# Patient Record
Sex: Female | Born: 1985 | Race: Black or African American | Hispanic: No | Marital: Married | Smoking: Former smoker
Health system: Southern US, Community
[De-identification: ages and names within clinical notes are randomized; demographics above are authoritative.]

## PROBLEM LIST (undated history)

## (undated) DIAGNOSIS — N760 Acute vaginitis: Secondary | ICD-10-CM

## (undated) DIAGNOSIS — E119 Type 2 diabetes mellitus without complications: Secondary | ICD-10-CM

## (undated) DIAGNOSIS — B9689 Other specified bacterial agents as the cause of diseases classified elsewhere: Secondary | ICD-10-CM

## (undated) DIAGNOSIS — N915 Oligomenorrhea, unspecified: Secondary | ICD-10-CM

## (undated) DIAGNOSIS — L680 Hirsutism: Secondary | ICD-10-CM

## (undated) DIAGNOSIS — Z8742 Personal history of other diseases of the female genital tract: Secondary | ICD-10-CM

## (undated) DIAGNOSIS — Z8619 Personal history of other infectious and parasitic diseases: Secondary | ICD-10-CM

## (undated) DIAGNOSIS — A599 Trichomoniasis, unspecified: Secondary | ICD-10-CM

## (undated) DIAGNOSIS — R51 Headache: Secondary | ICD-10-CM

## (undated) DIAGNOSIS — N9489 Other specified conditions associated with female genital organs and menstrual cycle: Secondary | ICD-10-CM

## (undated) DIAGNOSIS — B079 Viral wart, unspecified: Secondary | ICD-10-CM

## (undated) HISTORY — DX: Hirsutism: L68.0

## (undated) HISTORY — DX: Viral wart, unspecified: B07.9

## (undated) HISTORY — DX: Acute vaginitis: N76.0

## (undated) HISTORY — DX: Other specified conditions associated with female genital organs and menstrual cycle: N94.89

## (undated) HISTORY — DX: Trichomoniasis, unspecified: A59.9

## (undated) HISTORY — PX: EAR CYST EXCISION: SHX22

## (undated) HISTORY — DX: Oligomenorrhea, unspecified: N91.5

## (undated) HISTORY — DX: Personal history of other diseases of the female genital tract: Z87.42

## (undated) HISTORY — DX: Other specified bacterial agents as the cause of diseases classified elsewhere: B96.89

## (undated) HISTORY — DX: Personal history of other infectious and parasitic diseases: Z86.19

---

## 2010-01-03 ENCOUNTER — Emergency Department (HOSPITAL_COMMUNITY): Admission: EM | Admit: 2010-01-03 | Discharge: 2010-01-03 | Payer: Self-pay | Admitting: Family Medicine

## 2011-02-01 ENCOUNTER — Emergency Department (HOSPITAL_COMMUNITY): Payer: Self-pay

## 2011-02-01 ENCOUNTER — Emergency Department (HOSPITAL_COMMUNITY)
Admission: EM | Admit: 2011-02-01 | Discharge: 2011-02-01 | Disposition: A | Discharge status: Home / Self Care | Payer: Self-pay | Attending: Emergency Medicine | Admitting: Emergency Medicine

## 2011-02-01 DIAGNOSIS — I10 Essential (primary) hypertension: Secondary | ICD-10-CM | POA: Insufficient documentation

## 2011-02-01 DIAGNOSIS — R109 Unspecified abdominal pain: Secondary | ICD-10-CM | POA: Insufficient documentation

## 2011-02-01 DIAGNOSIS — M549 Dorsalgia, unspecified: Secondary | ICD-10-CM | POA: Insufficient documentation

## 2011-02-01 DIAGNOSIS — E669 Obesity, unspecified: Secondary | ICD-10-CM | POA: Insufficient documentation

## 2011-02-01 DIAGNOSIS — T148XXA Other injury of unspecified body region, initial encounter: Secondary | ICD-10-CM | POA: Insufficient documentation

## 2011-02-01 DIAGNOSIS — X500XXA Overexertion from strenuous movement or load, initial encounter: Secondary | ICD-10-CM | POA: Insufficient documentation

## 2011-02-01 LAB — URINALYSIS, ROUTINE W REFLEX MICROSCOPIC
Bilirubin Urine: NEGATIVE
Ketones, ur: NEGATIVE mg/dL
Nitrite: NEGATIVE
Protein, ur: NEGATIVE mg/dL
Urobilinogen, UA: 0.2 mg/dL (ref 0.0–1.0)

## 2011-02-01 LAB — POCT PREGNANCY, URINE: Preg Test, Ur: NEGATIVE

## 2011-02-02 LAB — URINE CULTURE
Colony Count: 35000
Culture  Setup Time: 201203090133

## 2011-02-15 LAB — POCT I-STAT, CHEM 8
BUN: 3 mg/dL — ABNORMAL LOW (ref 6–23)
Hemoglobin: 14.6 g/dL (ref 12.0–15.0)
Sodium: 138 mEq/L (ref 135–145)
TCO2: 32 mmol/L (ref 0–100)

## 2011-02-15 LAB — POCT URINALYSIS DIP (DEVICE)
Ketones, ur: NEGATIVE mg/dL
Nitrite: NEGATIVE
Protein, ur: >=300 mg/dL — AB
Urobilinogen, UA: 0.2 mg/dL (ref 0.0–1.0)
pH: 6.5 (ref 5.0–8.0)

## 2011-02-15 LAB — POCT PREGNANCY, URINE: Preg Test, Ur: NEGATIVE

## 2011-02-15 LAB — WET PREP, GENITAL

## 2011-05-17 ENCOUNTER — Emergency Department (HOSPITAL_COMMUNITY)
Admission: EM | Admit: 2011-05-17 | Discharge: 2011-05-17 | Disposition: A | Discharge status: Home / Self Care | Payer: Self-pay | Attending: Emergency Medicine | Admitting: Emergency Medicine

## 2011-05-17 DIAGNOSIS — N898 Other specified noninflammatory disorders of vagina: Secondary | ICD-10-CM | POA: Insufficient documentation

## 2011-05-17 DIAGNOSIS — R059 Cough, unspecified: Secondary | ICD-10-CM | POA: Insufficient documentation

## 2011-05-17 DIAGNOSIS — J3489 Other specified disorders of nose and nasal sinuses: Secondary | ICD-10-CM | POA: Insufficient documentation

## 2011-05-17 DIAGNOSIS — M542 Cervicalgia: Secondary | ICD-10-CM | POA: Insufficient documentation

## 2011-05-17 DIAGNOSIS — J069 Acute upper respiratory infection, unspecified: Secondary | ICD-10-CM | POA: Insufficient documentation

## 2011-05-17 DIAGNOSIS — R070 Pain in throat: Secondary | ICD-10-CM | POA: Insufficient documentation

## 2011-05-17 DIAGNOSIS — R109 Unspecified abdominal pain: Secondary | ICD-10-CM | POA: Insufficient documentation

## 2011-05-17 DIAGNOSIS — S139XXA Sprain of joints and ligaments of unspecified parts of neck, initial encounter: Secondary | ICD-10-CM | POA: Insufficient documentation

## 2011-05-17 DIAGNOSIS — E669 Obesity, unspecified: Secondary | ICD-10-CM | POA: Insufficient documentation

## 2011-05-17 DIAGNOSIS — X58XXXA Exposure to other specified factors, initial encounter: Secondary | ICD-10-CM | POA: Insufficient documentation

## 2011-05-17 DIAGNOSIS — J45909 Unspecified asthma, uncomplicated: Secondary | ICD-10-CM | POA: Insufficient documentation

## 2011-05-17 DIAGNOSIS — R05 Cough: Secondary | ICD-10-CM | POA: Insufficient documentation

## 2011-05-17 LAB — URINALYSIS, ROUTINE W REFLEX MICROSCOPIC
Ketones, ur: NEGATIVE mg/dL
Leukocytes, UA: NEGATIVE
Nitrite: NEGATIVE
Protein, ur: NEGATIVE mg/dL
pH: 6.5 (ref 5.0–8.0)

## 2011-05-17 LAB — WET PREP, GENITAL: WBC, Wet Prep HPF POC: NONE SEEN

## 2011-05-17 LAB — POCT PREGNANCY, URINE: Preg Test, Ur: NEGATIVE

## 2011-05-18 LAB — GC/CHLAMYDIA PROBE AMP, GENITAL
Chlamydia, DNA Probe: NEGATIVE
GC Probe Amp, Genital: NEGATIVE

## 2011-08-20 ENCOUNTER — Other Ambulatory Visit: Payer: Self-pay | Admitting: Obstetrics and Gynecology

## 2011-09-05 DIAGNOSIS — N9489 Other specified conditions associated with female genital organs and menstrual cycle: Secondary | ICD-10-CM

## 2011-09-05 HISTORY — DX: Other specified conditions associated with female genital organs and menstrual cycle: N94.89

## 2011-09-05 NOTE — Patient Instructions (Addendum)
   Your procedure is scheduled on: Thursday, Oct. 18, 2012 at 10:45am  Enter through the Main Entrance of [facility] at: 9:15am Pick up the phone at the desk and dial [phone] and inform us of your arrival.  Please call this number if you have any problems the morning of surgery: [phone]  Remember: Do not eat food after midnight: Wednesday Do not drink clear liquids after:  Wednesday Take these medicines the morning of surgery with a SIP OF WATER:  none  Do not wear jewelry, make-up, or FINGER nail polish Do not wear lotions, powders, or perfumes.  You may wear deodorant. Do not shave 48 hours prior to surgery. Do not bring valuables to the hospital.  Pt's mother patient's mother Malachi Bonds  [phone] to take pt. Home.  Remember to use your hibiclens as instructed.Please shower with 1/2 bottle the evening before your surgery and the other 1/2 bottle the morning of surgery.

## 2011-09-06 ENCOUNTER — Encounter (HOSPITAL_COMMUNITY)
Admission: RE | Admit: 2011-09-06 | Discharge: 2011-09-06 | Disposition: A | Discharge status: Home / Self Care | Payer: BC Managed Care – PPO | Source: Ambulatory Visit | Attending: Obstetrics and Gynecology | Admitting: Obstetrics and Gynecology

## 2011-09-06 ENCOUNTER — Encounter (HOSPITAL_COMMUNITY): Payer: Self-pay

## 2011-09-06 HISTORY — DX: Headache: R51

## 2011-09-06 LAB — CBC
Platelets: 238 10*3/uL (ref 150–400)
RBC: 4.29 MIL/uL (ref 3.87–5.11)
WBC: 11 10*3/uL — ABNORMAL HIGH (ref 4.0–10.5)

## 2011-09-07 NOTE — H&P (Signed)
NAME:  Stacey Delgado, Stacey Delgado NO.:  [id number]  MEDICAL RECORD NO.:  [id number]  LOCATION:  [facility]                         FACILITY:  [facility]  PHYSICIAN:  Osborn Coho, M.D.   DATE OF BIRTH:  Sep 25, 1986  DATE OF ADMISSION:  08/17/2011 DATE OF DISCHARGE:                             HISTORY & PHYSICAL   HISTORY OF PRESENT ILLNESS:  Stacey Delgado is a 25 year old single black female, gravida 0 presenting for hysteroscopy, D and C with possible resection of an endometrial mass because of menorrhagia.  Over 10 years, the patient reports that she has always had heavy menstrual periods, but in the past year, symptoms have increased.  She goes on to say that she has missed as many as 5 consecutive menstrual periods and at one time was placed on oral contraceptives to regulate her periods. When the patient took oral contraceptives, her periods were regular but once she discontinued, her irregularity returned.  In the past year, the patient's menstrual flow may last 14-18 days during which time she may have to change a pad every 30 minutes to an hour.  There have been occasions that she passes such large clots that she will soil her clothing and furniture.  She reports cramping that she rates as a 10/10 on a 10 point pain scale, however, finds relief with ibuprofen 800 mg (decreasing her pain to a 2/10 on a 10 point pain scale). She admits to nausea with this flow but denies any urinary tract symptoms, changes in her bowel movements, dyspareunia, or vaginitis symptoms.  An endometrial biopsy done in September, 2012 revealed fragments suggestive of benign endometrial polyps, but was negative for hyperplasia, atypia, or malignancy.  A pelvic ultrasound at that same time showed a uterus measuring 6.90 x 5.15 x 3.87 cm, and a possible endometrial mass measuring 1.86 x 1.87 x 1.58 cm.  Both the patient's ovaries appeared normal on that study.  Given the protracted and disruptive  nature of her symptoms and after reviewing both medical and surgical management options, the patient has decided to proceed with hysteroscopy, D and C with possible resection of an endometrial mass.  PAST MEDICAL HISTORY/OB HISTORY:  Gravida 0.  GYNECOLOGIC HISTORY:  Menarche 25 years old.  Her last menstrual period was September 03, 2011.  She uses abstinence for contraception.  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  Her last normal Pap smear was October 2012.  MEDICAL HISTORY:  Asthma, vitamin D deficiency, hirsutism, and anemia.  SURGICAL HISTORY:  Negative.  FAMILY HISTORY:  Asthma, hypertension, diabetes mellitus, migraines, breast cancer (mother at age 35).  SOCIAL HISTORY:  The patient is single and she is a Consulting civil engineer at [employer].  HABITS:  She occasionally consumes alcohol.  Denies any tobacco or illicit drug use.  CURRENT MEDICATIONS:  Vitamin D 50,000 units twice weekly.  ALLERGIES:  She has no known drug allergies.  Denies any sensitivities to latex, soy, peanuts, or shellfish.  She does, however, develop a rash when exposed to cherries.  REVIEW OF SYSTEMS:  The patient wears contact lenses.  She does have frequent headaches for which she received a  negative neurologic workup. Denies any chest pain, shortness of breath, vision changes, difficulty swallowing, chronic cough, nausea, vomiting, diarrhea, dysuria, hematuria, urinary frequency, myalgias, arthralgias, night sweats, joint swelling, and except as is mentioned in history of present illness, the patient's review of systems is otherwise negative.  PHYSICAL EXAMINATION:  VITAL SIGNS:  Blood pressure 120/70, pulse is 72, respiration 18, temperature 97.9 degrees Fahrenheit orally, weight 282 pounds, height 5 feet 4-1/2 inches tall, body mass index 48.4. NECK:  Supple without masses.  There is no  thyromegaly or cervical adenopathy. HEART:  Regular rate and rhythm. LUNGS:  Clear. BACK:  No CVA tenderness. ABDOMEN:  No tenderness, masses, or organomegaly. EXTREMITIES:  No clubbing, cyanosis, or edema. PELVIC:  EGBUS is normal.  Vagina is normal.  There is blood in the vaginal vault.  Cervix is nontender without lesions.  Uterus appears normal size, shape, and consistency without tenderness though exam is limited by body habitus.  Adnexa without tenderness or masses.  IMPRESSION: 1. Menorrhagia. 2. Endometrial mass 3. Oligomenorrhea.  DISPOSITION:  Discussion was held with the patient regarding the indications for her procedures along with their risks which include, but are not limited to, reaction to anesthesia, damage to adjacent organs, infection, excessive bleeding, and endometrial scarring.  The patient was given an ACOG brochure on hysteroscopy and an ACOG brochure on the dilatation and curettage.  The patient verbalized understanding of these risks and has consented to proceed with hysteroscopy, D and C, possible resection of an endometrial mass at [facility] on September 13, 2011 at 10:45 a.m.     Erasto Sleight J. Lowell Guitar, P.A.-C   ______________________________ Osborn Coho, M.D.    EJP/MEDQ  D:  09/06/2011  T:  09/06/2011  Job:  [id number]  09-13-11 Agree with above and no change in H&P. - AYR

## 2011-09-13 ENCOUNTER — Ambulatory Visit (HOSPITAL_COMMUNITY): Payer: BC Managed Care – PPO | Admitting: Anesthesiology

## 2011-09-13 ENCOUNTER — Other Ambulatory Visit: Payer: Self-pay | Admitting: Obstetrics and Gynecology

## 2011-09-13 ENCOUNTER — Encounter (HOSPITAL_COMMUNITY): Payer: Self-pay | Admitting: *Deleted

## 2011-09-13 ENCOUNTER — Ambulatory Visit (HOSPITAL_COMMUNITY)
Admission: RE | Admit: 2011-09-13 | Discharge: 2011-09-13 | Disposition: A | Discharge status: Home / Self Care | Payer: BC Managed Care – PPO | Source: Ambulatory Visit | Attending: Obstetrics and Gynecology | Admitting: Obstetrics and Gynecology

## 2011-09-13 ENCOUNTER — Encounter (HOSPITAL_COMMUNITY): Payer: Self-pay | Admitting: Anesthesiology

## 2011-09-13 ENCOUNTER — Encounter (HOSPITAL_COMMUNITY)
Admission: RE | Disposition: A | Discharge status: Home / Self Care | Payer: Self-pay | Source: Ambulatory Visit | Attending: Obstetrics and Gynecology

## 2011-09-13 DIAGNOSIS — N920 Excessive and frequent menstruation with regular cycle: Secondary | ICD-10-CM | POA: Insufficient documentation

## 2011-09-13 DIAGNOSIS — N840 Polyp of corpus uteri: Secondary | ICD-10-CM | POA: Insufficient documentation

## 2011-09-13 HISTORY — PX: HYSTEROSCOPY W/D&C: SHX1775

## 2011-09-13 LAB — HCG, SERUM, QUALITATIVE: Preg, Serum: NEGATIVE

## 2011-09-13 SURGERY — DILATATION AND CURETTAGE /HYSTEROSCOPY
Anesthesia: General | Site: Vagina | Wound class: Clean Contaminated

## 2011-09-13 MED ORDER — MIDAZOLAM HCL 5 MG/5ML IJ SOLN
INTRAMUSCULAR | Status: DC | PRN
  Administered 2011-09-13: 1 mg via INTRAVENOUS

## 2011-09-13 MED ORDER — GLYCOPYRROLATE 0.2 MG/ML IJ SOLN
INTRAMUSCULAR | Status: DC | PRN
  Administered 2011-09-13: 0.2 mg via INTRAVENOUS

## 2011-09-13 MED ORDER — KETOROLAC TROMETHAMINE 60 MG/2ML IM SOLN
INTRAMUSCULAR | Status: AC
  Filled 2011-09-13: qty 2

## 2011-09-13 MED ORDER — DEXAMETHASONE SODIUM PHOSPHATE 10 MG/ML IJ SOLN
INTRAMUSCULAR | Status: AC
  Filled 2011-09-13: qty 1

## 2011-09-13 MED ORDER — EPHEDRINE SULFATE 50 MG/ML IJ SOLN
INTRAMUSCULAR | Status: DC | PRN
  Administered 2011-09-13: 5 mg via INTRAVENOUS

## 2011-09-13 MED ORDER — DEXAMETHASONE SODIUM PHOSPHATE 4 MG/ML IJ SOLN
INTRAMUSCULAR | Status: DC | PRN
  Administered 2011-09-13: 10 mg via INTRAVENOUS

## 2011-09-13 MED ORDER — ONDANSETRON HCL 4 MG/2ML IJ SOLN
INTRAMUSCULAR | Status: AC
  Filled 2011-09-13: qty 2

## 2011-09-13 MED ORDER — HYDROCODONE-ACETAMINOPHEN 5-500 MG PO TABS: 1.0000 | ORAL_TABLET | Freq: Four times a day (QID) | ORAL | Status: AC | PRN

## 2011-09-13 MED ORDER — FENTANYL CITRATE 0.05 MG/ML IJ SOLN
INTRAMUSCULAR | Status: AC
  Filled 2011-09-13: qty 2

## 2011-09-13 MED ORDER — FENTANYL CITRATE 0.05 MG/ML IJ SOLN
INTRAMUSCULAR | Status: DC | PRN
  Administered 2011-09-13 (×2): 50 ug via INTRAVENOUS

## 2011-09-13 MED ORDER — LIDOCAINE HCL (CARDIAC) 20 MG/ML IV SOLN
INTRAVENOUS | Status: DC | PRN
  Administered 2011-09-13: 100 mg via INTRAVENOUS

## 2011-09-13 MED ORDER — KETOROLAC TROMETHAMINE 60 MG/2ML IM SOLN
INTRAMUSCULAR | Status: DC | PRN
  Administered 2011-09-13: 30 mg via INTRAMUSCULAR

## 2011-09-13 MED ORDER — KETOROLAC TROMETHAMINE 30 MG/ML IJ SOLN
INTRAMUSCULAR | Status: DC | PRN
  Administered 2011-09-13: 30 mg via INTRAVENOUS

## 2011-09-13 MED ORDER — ONDANSETRON HCL 4 MG/2ML IJ SOLN
INTRAMUSCULAR | Status: DC | PRN
  Administered 2011-09-13: 4 mg via INTRAVENOUS

## 2011-09-13 MED ORDER — PROPOFOL 10 MG/ML IV EMUL
INTRAVENOUS | Status: DC | PRN
  Administered 2011-09-13: 200 mg via INTRAVENOUS

## 2011-09-13 MED ORDER — MIDAZOLAM HCL 2 MG/2ML IJ SOLN
INTRAMUSCULAR | Status: AC
  Filled 2011-09-13: qty 2

## 2011-09-13 MED ORDER — LIDOCAINE HCL 1 % IJ SOLN
INTRAMUSCULAR | Status: DC | PRN
  Administered 2011-09-13: 10 mL

## 2011-09-13 MED ORDER — PROPOFOL 10 MG/ML IV EMUL
INTRAVENOUS | Status: AC
  Filled 2011-09-13: qty 20

## 2011-09-13 MED ORDER — LIDOCAINE HCL (CARDIAC) 20 MG/ML IV SOLN
INTRAVENOUS | Status: AC
  Filled 2011-09-13: qty 5

## 2011-09-13 MED ORDER — LACTATED RINGERS IV SOLN
INTRAVENOUS | Status: DC
  Administered 2011-09-13: 10:00:00 via INTRAVENOUS

## 2011-09-13 MED ORDER — IBUPROFEN 600 MG PO TABS: 600.0000 mg | ORAL_TABLET | Freq: Four times a day (QID) | ORAL | Status: AC | PRN

## 2011-09-13 SURGICAL SUPPLY — 16 items
CANISTER SUCTION 2500CC (MISCELLANEOUS) ×3 IMPLANT
CATH ROBINSON RED A/P 16FR (CATHETERS) ×3 IMPLANT
CLOTH BEACON ORANGE TIMEOUT ST (SAFETY) ×3 IMPLANT
CONTAINER PREFILL 10% NBF 60ML (FORM) ×6 IMPLANT
DRAPE UTILITY XL STRL (DRAPES) ×6 IMPLANT
ELECT REM PT RETURN 9FT ADLT (ELECTROSURGICAL) ×3
ELECTRODE REM PT RTRN 9FT ADLT (ELECTROSURGICAL) ×2 IMPLANT
GLOVE BIO SURGEON STRL SZ7.5 (GLOVE) ×6 IMPLANT
GLOVE BIOGEL PI IND STRL 7.5 (GLOVE) ×2 IMPLANT
GLOVE BIOGEL PI INDICATOR 7.5 (GLOVE) ×1
GOWN PREVENTION PLUS LG XLONG (DISPOSABLE) ×3 IMPLANT
GOWN STRL REIN XL XLG (GOWN DISPOSABLE) ×3 IMPLANT
LOOP ANGLED CUTTING 22FR (CUTTING LOOP) IMPLANT
PACK HYSTEROSCOPY LF (CUSTOM PROCEDURE TRAY) ×3 IMPLANT
TOWEL OR 17X24 6PK STRL BLUE (TOWEL DISPOSABLE) ×6 IMPLANT
WATER STERILE IRR 1000ML POUR (IV SOLUTION) ×3 IMPLANT

## 2011-09-13 NOTE — Anesthesia Postprocedure Evaluation (Signed)
Anesthesia Post Note  Patient: Stacey Delgado  Procedure(s) Performed:  DILATATION AND CURETTAGE (D&C) /HYSTEROSCOPY  Anesthesia type: GA  Patient location: PACU  Post pain: Pain level controlled  Post assessment: Post-op Vital signs reviewed  Last Vitals:  Filed Vitals:   09/13/11 1254  BP:   Pulse:   Temp: 98.2 F (36.8 C)  Resp:     Post vital signs: Reviewed  Level of consciousness: sedated  Complications: No apparent anesthesia complications

## 2011-09-13 NOTE — Anesthesia Preprocedure Evaluation (Signed)
Anesthesia Evaluation  Name, MR# and DOB Patient awake  General Assessment Comment  Reviewed: Allergy & Precautions, H&P , NPO status , Patient's Chart, lab work & pertinent test results, reviewed documented beta blocker date and time   History of Anesthesia Complications Negative for: history of anesthetic complications  Airway Mallampati: III TM Distance: >3 FB Neck ROM: full    Dental  (+) Teeth Intact   Pulmonary asthma (has never used an inhaler) former smoker (quit 2010) clear to auscultation  Pulmonary exam normal       Cardiovascular Exercise Tolerance: Good regular Normal    Neuro/Psych  Headaches (lately better, but had been everyday.), Negative Psych ROS   GI/Hepatic negative GI ROS Neg liver ROS    Endo/Other  Morbid obesity  Renal/GU negative Renal ROS  Genitourinary negative   Musculoskeletal   Abdominal   Peds  Hematology negative hematology ROS (+)   Anesthesia Other Findings   Reproductive/Obstetrics negative OB ROS                           Anesthesia Physical Anesthesia Plan  ASA: II  Anesthesia Plan: General   Post-op Pain Management:    Induction:   Airway Management Planned:   Additional Equipment:   Intra-op Plan:   Post-operative Plan:   Informed Consent: I have reviewed the patients History and Physical, chart, labs and discussed the procedure including the risks, benefits and alternatives for the proposed anesthesia with the patient or authorized representative who has indicated his/her understanding and acceptance.   Dental Advisory Given  Plan Discussed with: CRNA and Surgeon  Anesthesia Plan Comments:         Anesthesia Quick Evaluation

## 2011-09-13 NOTE — Op Note (Signed)
Preop Diagnosis: Menorrhagia, Endometrial Mass   Postop Diagnosis: * No post-op diagnosis entered *   Procedure: DILATATION & CURETTAGE/HYSTEROSCOPY WITH RESECTOSCOPE   Anesthesia: Choice   Anesthesiologist: Amy L. Rodman Pickle   Attending: Purcell Nails, MD   Assistant: N/a  Findings: No obvious intracavitary lesions  Pathology: Endometrial currettings  Fluids: 1000cc  UOP: QS via straight cath prior to procedure  EBL: Minimal  Complications: None  Procedure: The patient was taken to the operating room after the risks, benefits and alternatives discussed with the patient. The patient verbalized understanding and consent signed and witnessed. The patient was placed under general anesthesia and prepped and draped in the normal sterile fashion and Time Out performed per protocol.  A bivalve speculum was placed in the patient's vagina and the anterior lip of the cervix was grasped with a single tooth tenaculum. A paracervical block was administered using a total of 10 cc of 1% lidocaine. The uterus sounded to 8 1/2 cm. The cervix was dilated for passage of the hysteroscope.  The hysteroscope was introduced into the uterine cavity and findings as noted above. Sharp curettage was performed until a gritty texture was noted and currettings sent to pathology. The hysteroscope was reintroduced and no obvious remaining intracavitary lesions were noted.  All instruments were removed. Sponge lap and needle count was correct. The patient tolerated the procedure well and was returned to the recovery room in good condition.

## 2011-09-13 NOTE — Anesthesia Procedure Notes (Signed)
Procedure Name: LMA Insertion Date/Time: 09/13/2011 11:01 AM Performed by: Karleen Dolphin Pre-anesthesia Checklist: Patient identified, Patient being monitored, Emergency Drugs available, Timeout performed and Suction available Patient Re-evaluated:Patient Re-evaluated prior to inductionOxygen Delivery Method: Circle System Utilized Preoxygenation: Pre-oxygenation with 100% oxygen Intubation Type: IV induction LMA: LMA inserted LMA Size: 4.0 Number of attempts: 1 Placement Confirmation: positive ETCO2 and breath sounds checked- equal and bilateral Tube secured with: Tape Dental Injury: Teeth and Oropharynx as per pre-operative assessment

## 2011-09-13 NOTE — Transfer of Care (Signed)
Immediate Anesthesia Transfer of Care Note  Patient: Stacey Delgado  Procedure(s) Performed:  DILATATION AND CURETTAGE (D&C) /HYSTEROSCOPY  Patient Location: PACU  Anesthesia Type: General  Level of Consciousness: awake, alert  and oriented  Airway & Oxygen Therapy: Patient Spontanous Breathing and Patient connected to nasal cannula oxygen  Post-op Assessment: Report given to PACU RN and Post -op Vital signs reviewed and stable  Post vital signs: Reviewed and stable  Complications: No apparent anesthesia complications

## 2011-09-14 ENCOUNTER — Encounter (HOSPITAL_COMMUNITY): Payer: Self-pay | Admitting: Obstetrics and Gynecology

## 2011-09-16 NOTE — Brief Op Note (Signed)
09/13/2011  8:18 PM  PATIENT:  Stacey Delgado  25 y.o. female  PRE-OPERATIVE DIAGNOSIS:  Menorrhagia, Endometrial Mass  POST-OPERATIVE DIAGNOSIS:  Menorrhagia, Endometrial Mass  PROCEDURE:  Procedure(s): DILATATION AND CURETTAGE (D&C) /HYSTEROSCOPY  SURGEON:  Surgeon(s): Purcell Nails, MD  PHYSICIAN ASSISTANT:   ASSISTANTS: none   ANESTHESIA:   general  EBL:   minimal  BLOOD ADMINISTERED:none  DRAINS: none   LOCAL MEDICATIONS USED:  LIDOCAINE 10 CC  SPECIMEN:  Source of Specimen:  endometrial currettings  DISPOSITION OF SPECIMEN:  PATHOLOGY  COUNTS:  YES  TOURNIQUET:  * No tourniquets in log *  DICTATION: .Note written in EPIC  PLAN OF CARE: Discharge to home after PACU  PATIENT DISPOSITION:  PACU - hemodynamically stable.   Delay start of Pharmacological VTE agent (>24hrs) due to surgical blood loss or risk of bleeding:  not applicable

## 2011-09-16 NOTE — Op Note (Signed)
Preop Diagnosis: Menorrhagia, Endometrial Mass   Postop Diagnosis: Menorrhagia, Endometrial Mass   Procedure: DILATATION & CURETTAGE/HYSTEROSCOPY WITH RESECTOSCOPE   Anesthesia: General   Attending: Dr. Su Hilt  Assistant: n/a  Findings: No obvious intracavitary lesions  Pathology: Endometrial currettings  Fluids:  UOP: QS via straight cath procedure prior to procedure  EBL: Minimal  Complications: None  Procedure: The patient was taken to the operating room after the risks, benefits and alternatives discussed with the patient. The patient verbalized understanding and consent signed and witnessed. The patient was placed under general anesthesia and prepped and draped in the normal sterile fashion and Time Out performed per protocol.  A bivalve speculum was placed in the patient's vagina and the anterior lip of the cervix was grasped with a single tooth tenaculum. A paracervical block was administered using a total of 10 cc of 1% lidocaine.  The cervix was dilated for passage of the hysteroscope.  The hysteroscope was introduced into the uterine cavity and findings as noted above. Sharp curettage was performed until a gritty texture was noted and currettings sent to pathology. The hysteroscope was reintroduced and no obvious remaining intracavitary lesions were noted.  All instruments were removed. Sponge lap and needle count was correct. The patient tolerated the procedure well and was returned to the recovery room in good condition.

## 2012-02-20 ENCOUNTER — Encounter (HOSPITAL_COMMUNITY): Payer: Self-pay | Admitting: Emergency Medicine

## 2012-02-20 ENCOUNTER — Encounter: Payer: Self-pay | Admitting: Obstetrics and Gynecology

## 2012-02-20 ENCOUNTER — Emergency Department (HOSPITAL_COMMUNITY)
Admission: EM | Admit: 2012-02-20 | Discharge: 2012-02-20 | Disposition: A | Discharge status: Home / Self Care | Payer: Self-pay | Attending: Emergency Medicine | Admitting: Emergency Medicine

## 2012-02-20 DIAGNOSIS — E119 Type 2 diabetes mellitus without complications: Secondary | ICD-10-CM | POA: Insufficient documentation

## 2012-02-20 DIAGNOSIS — J45909 Unspecified asthma, uncomplicated: Secondary | ICD-10-CM | POA: Insufficient documentation

## 2012-02-20 DIAGNOSIS — Z87891 Personal history of nicotine dependence: Secondary | ICD-10-CM | POA: Insufficient documentation

## 2012-02-20 LAB — BASIC METABOLIC PANEL
Calcium: 10.1 mg/dL (ref 8.4–10.5)
Creatinine, Ser: 0.56 mg/dL (ref 0.50–1.10)
GFR calc Af Amer: >90 mL/min (ref >90)
GFR calc non Af Amer: >90 mL/min (ref >90)
Sodium: 134 mEq/L — ABNORMAL LOW (ref 135–145)

## 2012-02-20 LAB — URINALYSIS, ROUTINE W REFLEX MICROSCOPIC
Bilirubin Urine: NEGATIVE
Nitrite: NEGATIVE
Specific Gravity, Urine: 1.042 — ABNORMAL HIGH (ref 1.005–1.030)
Urobilinogen, UA: 0.2 mg/dL (ref 0.0–1.0)

## 2012-02-20 LAB — URINE MICROSCOPIC-ADD ON

## 2012-02-20 LAB — POCT PREGNANCY, URINE: Preg Test, Ur: NEGATIVE

## 2012-02-20 MED ORDER — GLYBURIDE 5 MG PO TABS: 5.0000 mg | ORAL_TABLET | Freq: Every day | ORAL | Status: AC

## 2012-02-20 MED ORDER — INSULIN ASPART 100 UNIT/ML ~~LOC~~ SOLN
5.0000 [IU] | Freq: Once | SUBCUTANEOUS | Status: AC
  Administered 2012-02-20: 5 [IU] via INTRAVENOUS
  Filled 2012-02-20: qty 5

## 2012-02-20 MED ORDER — SODIUM CHLORIDE 0.9 % IV BOLUS (SEPSIS)
1000.0000 mL | Freq: Once | INTRAVENOUS | Status: AC
  Administered 2012-02-20: 1000 mL via INTRAVENOUS

## 2012-02-20 NOTE — ED Notes (Signed)
Pt states she went to pcp today do see if she was pregnant but they told her that her urine had to much glucose so they did not complete urine pregnancy test.

## 2012-02-20 NOTE — ED Provider Notes (Signed)
History     CSN: [id number]  Arrival date & time 02/20/12  1248   First MD Initiated Contact with Patient 02/20/12 1302      Chief Complaint  Patient presents with  . Hyperglycemia    (Consider location/radiation/quality/duration/timing/severity/associated sxs/prior treatment) The history is provided by the patient.   the patient is a 26 year old morbidly obese.  Patient with a history of asthma, who presents to the emergency department stating that her blood sugar was greater than 500.  She has never been diagnosed with diabetes.  Before.  She has had polyuria, polydipsia for several weeks, along with weight loss.  She also has had nausea and vomiting.  She denies pyuria, dysuria, cough, or shortness of breath.  She went to see her physician, and obstetrician today, who noted.  Sugar in her urine, therefore, they did a blood tests, noted.  It was elevated, as well, and they sent the patient, here.  She denies pain anywhere.  Past Medical History  Diagnosis Date  . Asthma     no inhaler  . Headache     otc med prn    Past Surgical History  Procedure Date  . Ear cyst excision   . Hysteroscopy w/d&c 09/13/2011    Procedure: DILATATION AND CURETTAGE (D&C) /HYSTEROSCOPY;  Surgeon: Purcell Nails, MD;  Location: [facility];  Service: Gynecology;  Laterality: N/A;    No family history on file.  History  Substance Use Topics  . Smoking status: Former Smoker    Types: Cigarettes    Quit date: 04/09/2009  . Smokeless tobacco: Never Used  . Alcohol Use: No     1-2- drinks per week    OB History    Grav Para Term Preterm Abortions TAB SAB Ect Mult Living                  Review of Systems  Constitutional: Positive for unexpected weight change. Negative for fever and chills.  Eyes: Negative for visual disturbance.  Respiratory: Negative for cough and shortness of breath.   Cardiovascular: Negative for chest pain.  Gastrointestinal: Positive for nausea and vomiting. Negative  for abdominal pain and diarrhea.  Genitourinary: Positive for frequency. Negative for dysuria and hematuria.  Neurological: Negative for weakness and headaches.  Psychiatric/Behavioral: Negative for confusion.  All other systems reviewed and are negative.    Allergies  Review of patient's allergies indicates no known allergies.  Home Medications   Current Outpatient Rx  Name Route Sig Dispense Refill  . VITAMIN D (ERGOCALCIFEROL) 50000 UNITS PO CAPS Oral Take 50,000 Units by mouth 2 (two) times a week.        BP 132/78  Pulse 87  Temp(Src) 98.7 F (37.1 C) (Oral)  Resp 20  SpO2 99%  LMP 12/11/2011  Physical Exam  Nursing note and vitals reviewed. Constitutional: She is oriented to person, place, and time. She appears well-developed. No distress.       Morbid obesity  HENT:  Head: Normocephalic and atraumatic.  Eyes: Pupils are equal, round, and reactive to light.  Neck: Normal range of motion.  Cardiovascular: Normal rate, regular rhythm and normal heart sounds.   No murmur heard. Pulmonary/Chest: Effort normal and breath sounds normal. No respiratory distress. She has no wheezes. She has no rales.  Abdominal: Soft. She exhibits no distension and no mass. There is no tenderness. There is no rebound and no guarding.  Musculoskeletal: Normal range of motion. She exhibits no edema and no tenderness.  Neurological: She is alert and oriented to person, place, and time. No cranial nerve deficit.  Skin: Skin is warm and dry. No rash noted. No erythema.  Psychiatric: She has a normal mood and affect. Her behavior is normal.    ED Course  Procedures (including critical care time) 26 year old, female, with new onset diabetes mellitus, presents to the emergency department with several week history of polyuria, polydipsia.  Involuntary weight loss.  Labs Reviewed  GLUCOSE, CAPILLARY - Abnormal; Notable for the following:    Glucose-Capillary 401 (*)    All other components  within normal limits  URINALYSIS, ROUTINE W REFLEX MICROSCOPIC  BASIC METABOLIC PANEL   No results found.   No diagnosis found.  3:15 PM Remains asx   MDM  New onset diabetes No evidence of dka.        Cheri Guppy, MD 02/20/12 1515

## 2012-02-20 NOTE — Discharge Instructions (Signed)
Your blood sugar is too high.  You have diabetes.  Use glyburide to control your blood sugar.  Follow up with [facility] for reevaluation.  Alternatively, follow up with any family practice doctor or at an urgent care center for reevaluation.  Call for appointment time.  Return for worse symptoms.

## 2012-02-20 NOTE — ED Notes (Signed)
Pt presenting to ed with c/o sent by pcp for hyperglycemia pt states blood sugar was 501at pcp office. Pt states frequent urination but she drinks a lot of water all the time. Pt states lower abdominal pain. Pt denies blurred vision. Pt is alert and oriented

## 2012-02-25 ENCOUNTER — Other Ambulatory Visit: Payer: Self-pay | Admitting: Obstetrics and Gynecology

## 2012-02-25 DIAGNOSIS — R638 Other symptoms and signs concerning food and fluid intake: Secondary | ICD-10-CM

## 2012-02-25 DIAGNOSIS — N949 Unspecified condition associated with female genital organs and menstrual cycle: Secondary | ICD-10-CM

## 2012-03-04 DIAGNOSIS — N762 Acute vulvitis: Secondary | ICD-10-CM

## 2012-03-04 DIAGNOSIS — R638 Other symptoms and signs concerning food and fluid intake: Secondary | ICD-10-CM | POA: Insufficient documentation

## 2012-03-05 ENCOUNTER — Other Ambulatory Visit: Payer: Self-pay

## 2012-03-05 ENCOUNTER — Encounter: Payer: Self-pay | Admitting: Obstetrics and Gynecology

## 2012-03-21 ENCOUNTER — Other Ambulatory Visit: Payer: Self-pay

## 2012-03-21 ENCOUNTER — Telehealth: Payer: Self-pay

## 2012-03-21 NOTE — Telephone Encounter (Signed)
Pt called back to let me know that she has an up-coming appt with Endocrinologist on 03/26/2012. Vit D will be called to the [facility] on [address]. Per AR. Recall will be put in epic for f/u. Stacey Delgado A

## 2012-03-21 NOTE — Telephone Encounter (Signed)
LM for pt to cb. She needs Vit D protocol and referral if that has not already been done by another provider. Pt was seen in ER and rec. To f/u with [facility].for DM.Melody Comas A

## 2012-03-21 NOTE — Telephone Encounter (Signed)
orders and recall for vit D entered into epic for f/u. Melody Comas A

## 2012-03-21 NOTE — Telephone Encounter (Signed)
Vit D 50,000 units 1x weekly x 12 weeks #20 0 RF called to [address]. Melody Comas A

## 2016-12-23 ENCOUNTER — Emergency Department (INDEPENDENT_AMBULATORY_CARE_PROVIDER_SITE_OTHER): Payer: BLUE CROSS/BLUE SHIELD

## 2016-12-23 ENCOUNTER — Emergency Department
Admission: EM | Admit: 2016-12-23 | Discharge: 2016-12-23 | Disposition: A | Discharge status: Home / Self Care | Payer: BLUE CROSS/BLUE SHIELD | Source: Home / Self Care | Attending: Family Medicine | Admitting: Family Medicine

## 2016-12-23 DIAGNOSIS — R05 Cough: Secondary | ICD-10-CM

## 2016-12-23 DIAGNOSIS — R52 Pain, unspecified: Secondary | ICD-10-CM

## 2016-12-23 DIAGNOSIS — R0789 Other chest pain: Secondary | ICD-10-CM

## 2016-12-23 DIAGNOSIS — R51 Headache: Secondary | ICD-10-CM

## 2016-12-23 DIAGNOSIS — R062 Wheezing: Secondary | ICD-10-CM | POA: Diagnosis not present

## 2016-12-23 DIAGNOSIS — R1084 Generalized abdominal pain: Secondary | ICD-10-CM

## 2016-12-23 DIAGNOSIS — R059 Cough, unspecified: Secondary | ICD-10-CM

## 2016-12-23 DIAGNOSIS — R519 Headache, unspecified: Secondary | ICD-10-CM

## 2016-12-23 LAB — POCT URINALYSIS DIP (MANUAL ENTRY)
Bilirubin, UA: NEGATIVE
Blood, UA: NEGATIVE
Glucose, UA: 500 — AB
Leukocytes, UA: NEGATIVE
Nitrite, UA: NEGATIVE
Protein Ur, POC: NEGATIVE
Spec Grav, UA: 1.015 (ref 1.005–1.03)
Urobilinogen, UA: 0.2 (ref 0–1)
pH, UA: 5.5 (ref 5–8)

## 2016-12-23 LAB — POCT URINE PREGNANCY: Preg Test, Ur: NEGATIVE

## 2016-12-23 MED ORDER — ALBUTEROL SULFATE HFA 108 (90 BASE) MCG/ACT IN AERS: 1.0000 | INHALATION_SPRAY | Freq: Four times a day (QID) | RESPIRATORY_TRACT | 0 refills | Status: DC | PRN

## 2016-12-23 MED ORDER — BENZONATATE 100 MG PO CAPS: 100.0000 mg | ORAL_CAPSULE | Freq: Three times a day (TID) | ORAL | 0 refills | Status: DC

## 2016-12-23 MED ORDER — PREDNISONE 20 MG PO TABS: ORAL_TABLET | ORAL | 0 refills | Status: DC

## 2016-12-23 NOTE — ED Provider Notes (Signed)
CSN: [id number]     Arrival date & time 12/23/16  1524 History   First MD Initiated Contact with Patient 12/23/16 1553     Chief Complaint  Patient presents with  . Headache   (Consider location/radiation/quality/duration/timing/severity/associated sxs/prior Treatment) HPI Donovan KailMelanie R Williams-Taft is a 31 y.o. female presenting to UC with c/o generalized abdominal pain, frontal headache, cough and congestion that started 2 days ago, wheeze was worse yesterday but improved with albuterol inhaler.  Mild nausea but no vomiting or diarrhea. She reports slip and fall on ice 1 week ago but denies hitting her head. She reports hx of headaches normally.  Denies change in vision. Denies numbness or tingling in arms or legs. No back pain, urinary or vaginal symptoms. She has taken OTC cough/cold medication for last two days with mild relief. No known sick contacts. She did not get the flu vaccine this year.    Past Medical History:  Diagnosis Date  . Asthma    no inhaler  . BV (bacterial vaginosis)    2012  . Endometrial mass 09/05/2011  . H/O chlamydia infection   . H/O menorrhagia   . Headache(784.0)    otc med prn  . Hirsutism   . Oligomenorrhea   . Trichomonas   . 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 Vitamin D deficiency    Past Surgical History:  Procedure Laterality Date  . EAR CYST EXCISION    . HYSTEROSCOPY W/D&C  09/13/2011   Procedure: DILATATION AND CURETTAGE (D&C) /HYSTEROSCOPY;  Surgeon: Purcell NailsAngela Y Roberts, MD;  Location: [facility];  Service: Gynecology;  Laterality: N/A;   Family History  Problem Relation Age of Onset  . Cancer Mother    Social History  Substance Use Topics  . Smoking status: Former Smoker    Types: Cigarettes    Quit date: 04/09/2009  . Smokeless tobacco: Never Used  . Alcohol use No     Comment: 1-2- drinks per week   OB History    No data available     Review of Systems  Constitutional: Negative for chills and fever.  HENT: Positive  for congestion. Negative for ear pain, sore throat, trouble swallowing and voice change.   Respiratory: Positive for cough. Negative for shortness of breath.   Cardiovascular: Negative for chest pain and palpitations.  Gastrointestinal: Positive for abdominal pain and nausea. Negative for diarrhea and vomiting.  Genitourinary: Negative for dysuria, flank pain, frequency and urgency.  Musculoskeletal: Negative for arthralgias, back pain and myalgias.  Skin: Negative for rash.  Neurological: Positive for headaches. Negative for dizziness and light-headedness.    Allergies  Benadryl [diphenhydramine] and Cherry  Home Medications   Prior to Admission medications   Medication Sig Start Date End Date Taking? Authorizing Provider  clotrimazole-betamethasone (LOTRISONE) cream Apply topically 2 (two) times daily.   Yes Historical Provider, MD  Prenatal Vit-Fe Fumarate-FA (PRENATAL MULTIVITAMIN) TABS Take 1 tablet by mouth daily.   Yes Historical Provider, MD  albuterol (PROVENTIL HFA;VENTOLIN HFA) 108 (90 Base) MCG/ACT inhaler Inhale 1-2 puffs into the lungs every 6 (six) hours as needed for wheezing or shortness of breath. 12/23/16   Junius FinnerErin O'Malley, PA-C  benzonatate (TESSALON) 100 MG capsule Take 1-2 capsules (100-200 mg total) by mouth every 8 (eight) hours. 12/23/16   Junius FinnerErin O'Malley, PA-C  predniSONE (DELTASONE) 20 MG tablet 3 tabs po day one, then 2 po daily x 4 days 12/23/16   Junius FinnerErin O'Malley, PA-C   Meds Ordered and Administered this  Visit  Medications - No data to display  BP 132/87 (BP Location: Left Arm)   Pulse 88   Temp 98.1 F (36.7 C) (Oral)   Ht 5\' 4"  (1.626 m)   Wt 264 lb (119.7 kg)   LMP 09/26/2016 (Approximate)   SpO2 96%   BMI 45.32 kg/m  No data found.   Physical Exam  Constitutional: She is oriented to person, place, and time. She appears well-developed and well-nourished. No distress.  HENT:  Head: Normocephalic and atraumatic.  Right Ear: Tympanic membrane normal.   Left Ear: Tympanic membrane normal.  Nose: Nose normal.  Mouth/Throat: Uvula is midline, oropharynx is clear and moist and mucous membranes are normal.  Eyes: EOM are normal.  Neck: Normal range of motion. Neck supple.  Cardiovascular: Normal rate and regular rhythm.   Pulmonary/Chest: Effort normal and breath sounds normal. No stridor. No respiratory distress. She has no wheezes. She has no rales.  Abdominal: Soft. She exhibits no distension and no mass. There is tenderness (suprapubic, mild). There is no rebound and no guarding.  Musculoskeletal: Normal range of motion.  Lymphadenopathy:    She has no cervical adenopathy.  Neurological: She is alert and oriented to person, place, and time.  Skin: Skin is warm and dry. She is not diaphoretic.  Psychiatric: She has a normal mood and affect. Her behavior is normal.  Nursing note and vitals reviewed.   Urgent Care Course     Procedures (including critical care time)  Labs Review Labs Reviewed  POCT URINALYSIS DIP (MANUAL ENTRY) - Abnormal; Notable for the following:       Result Value   Glucose, UA =500 (*)    Ketones, POC UA trace (5) (*)    All other components within normal limits  POCT URINE PREGNANCY - Normal    Imaging Review Dg Chest 2 View  Result Date: 12/23/2016 CLINICAL DATA:  Cough, wheezing and chest tightness for 3 days. EXAM: CHEST  2 VIEW COMPARISON:  None. FINDINGS: The cardiomediastinal silhouette is unremarkable. Mild peribronchial thickening noted. There is no evidence of focal airspace disease, pulmonary edema, suspicious pulmonary nodule/mass, pleural effusion, or pneumothorax. No acute bony abnormalities are identified. IMPRESSION: Mild peribronchial thickening without focal pneumonia, of uncertain chronicity Electronically Signed   By: Harmon Pier M.D.   On: 12/23/2016 16:19     MDM   1. Frontal headache   2. Body aches   3. Generalized abdominal pain   4. Cough   5. Wheeze    Pt presenting to UC  with multiple complaints most c/w a viral illness. Lungs: CTAB, however, due to reports of SOB, CXR performed to r/o underlying early pneumonia  CXR: mild peribronchial thickening w/o focal pneumonia.  Reassured pt, symptoms likely viral. Rx: Prednisone, albuterol, and tessalon encouraged fluids and rest. She declined zofran or phenergan as nausea is minimal  F/u with PCP in 1 week if not improving.     Junius Finner, PA-C 12/23/16 1644

## 2016-12-23 NOTE — ED Triage Notes (Signed)
Patient complains of abdominal pain and headache, she states that she fell on the ice last Saturday and that's when she started having issues, states abdominal pain comes and goes, denies any diarrhea or vomiting and was only nauseas a little today, states the pain radiates from side to side and it comes and goes. States she does not think she hit her head and has a history of headaches anyway.

## 2017-01-20 ENCOUNTER — Encounter: Payer: Self-pay | Admitting: Emergency Medicine

## 2017-01-20 ENCOUNTER — Emergency Department
Admission: EM | Admit: 2017-01-20 | Discharge: 2017-01-20 | Disposition: A | Discharge status: Home / Self Care | Payer: 59 | Source: Home / Self Care | Attending: Family Medicine | Admitting: Family Medicine

## 2017-01-20 DIAGNOSIS — N921 Excessive and frequent menstruation with irregular cycle: Secondary | ICD-10-CM

## 2017-01-20 DIAGNOSIS — R109 Unspecified abdominal pain: Secondary | ICD-10-CM

## 2017-01-20 LAB — POCT CBC W AUTO DIFF (K'VILLE URGENT CARE)

## 2017-01-20 LAB — POCT URINE PREGNANCY: Preg Test, Ur: NEGATIVE

## 2017-01-20 LAB — POCT FASTING CBG KUC MANUAL ENTRY: POCT Glucose (KUC): 246 mg/dL — AB (ref 70–99)

## 2017-01-20 NOTE — ED Triage Notes (Signed)
Pt c/o severe abdominal cramping that comes and goes since last night.  Some vaginal bleeding, some blood clots, bleeding has eased up.

## 2017-01-20 NOTE — Discharge Instructions (Signed)
Your menstrual cramps and heavy flow could be due to no longer being on birth control as stress, changes in hormones, or various other things can alter a woman's menstrual cycle.    You may need to have a pelvic ultrasound to rule out a cyst, fibroid, or other physical cause of a heavy cycle.  You may need to try a different form of birth control to help control your symptoms.

## 2017-01-20 NOTE — ED Provider Notes (Signed)
CSN: [id number]     Arrival date & time 01/20/17  1727 History   First MD Initiated Contact with Patient 01/20/17 1831     Chief Complaint  Patient presents with  . Abdominal Cramping   (Consider location/radiation/quality/duration/timing/severity/associated sxs/prior Treatment) HPI  Stacey Delgado is a 31 y.o. female presenting to UC with c/o severe abdominal cramping and heavy menstrual bleeding.  She reports having a hx of irregular cycle. LMP was in Oct/Nov 2017.  Around that time, she was had an Implanon type birth control in place but was having heavy cycles almost non-stop and was becoming anemic so she had the birth control taken out.  She is not currently on any form of birth control. Denies fever, chills, n/v/d. Denies dysuria.  Bleeding has slowed some but she was concerned she may become anemic again if it continues.  She has not f/u with GYN yet as symptoms just started last night.   Denies known hx of ovarian cysts or fibroids but has been told she may have polyps.  Denies concern for STIs or pregnancy.   Past Medical History:  Diagnosis Date  . Asthma    no inhaler  . BV (bacterial vaginosis)    2012  . Endometrial mass 09/05/2011  . H/O chlamydia infection   . H/O menorrhagia   . Headache(784.0)    otc med prn  . Hirsutism   . Oligomenorrhea   . Trichomonas   . 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 Vitamin D deficiency    Past Surgical History:  Procedure Laterality Date  . EAR CYST EXCISION    . HYSTEROSCOPY W/D&C  09/13/2011   Procedure: DILATATION AND CURETTAGE (D&C) /HYSTEROSCOPY;  Surgeon: Purcell NailsAngela Y Roberts, MD;  Location: [facility];  Service: Gynecology;  Laterality: N/A;   Family History  Problem Relation Age of Onset  . Cancer Mother    Social History  Substance Use Topics  . Smoking status: Former Smoker    Types: Cigarettes    Quit date: 04/09/2009  . Smokeless tobacco: Never Used  . Alcohol use No     Comment: 1-2- drinks per  week   OB History    No data available     Review of Systems  Constitutional: Negative for chills and fever.  Respiratory: Negative for chest tightness and shortness of breath.   Cardiovascular: Negative for chest pain and palpitations.  Gastrointestinal: Positive for abdominal pain ( lower abdominal cramping). Negative for diarrhea, nausea and vomiting.  Genitourinary: Positive for pelvic pain ( cramping) and vaginal bleeding. Negative for dysuria and flank pain.  Musculoskeletal: Negative for arthralgias and myalgias.  Neurological: Positive for headaches. Negative for dizziness and light-headedness.    Allergies  Benadryl [diphenhydramine] and Cherry  Home Medications   Prior to Admission medications   Medication Sig Start Date End Date Taking? Authorizing Provider  Prenatal Vit-Fe Fumarate-FA (PRENATAL MULTIVITAMIN) TABS Take 1 tablet by mouth daily.    Historical Provider, MD   Meds Ordered and Administered this Visit  Medications - No data to display  BP 96/69 (BP Location: Left Arm)   Pulse 89   Temp 98.1 F (36.7 C) (Oral)   Ht 5\' 4"  (1.626 m)   Wt 264 lb 8 oz (120 kg)   SpO2 96%   BMI 45.40 kg/m  No data found.   Physical Exam  Constitutional: She is oriented to person, place, and time. She appears well-developed and well-nourished. No distress.  Sitting on exam  bed, appears well, non-toxic. NAD  HENT:  Head: Normocephalic and atraumatic.  Eyes: EOM are normal.  Neck: Normal range of motion.  Cardiovascular: Normal rate.   Pulmonary/Chest: Effort normal and breath sounds normal. No respiratory distress. She has no wheezes. She has no rales.  Abdominal: Soft. She exhibits no distension and no mass. There is no tenderness. There is no rebound and no guarding.  Musculoskeletal: Normal range of motion.  Neurological: She is alert and oriented to person, place, and time.  Skin: Skin is warm and dry. She is not diaphoretic.  Psychiatric: She has a normal mood  and affect. Her behavior is normal.  Nursing note and vitals reviewed.   Urgent Care Course     Procedures (including critical care time)  Labs Review Labs Reviewed  POCT FASTING CBG [facility] MANUAL ENTRY - Abnormal; Notable for the following:       Result Value   POCT Glucose ([facility]) 246 (*)    All other components within normal limits  POCT URINE PREGNANCY  POCT CBC W AUTO DIFF ([facility])    Imaging Review No results found.    MDM   1. Abdominal cramping   2. Menorrhagia with irregular cycle    Heavy menstrual cycle and abdominal cramping.  Pt did take out her birth control due to irregular cycles in Oct/Nov 2017, which is her last known cycle.    Urine preg: negative CBC: WNL, no evidence of anemia   Encouraged f/u with GYN this week for further evaluation as she may need to get back on birth control and/or have a pelvic U/S to evaluate for fibroids or cysts.   Discussed symptoms that warrant emergent care in the ED. Patient verbalized understanding and agreement with treatment plan.     Junius Finner, PA-C 01/22/17 1304

## 2017-03-12 ENCOUNTER — Emergency Department
Admission: EM | Admit: 2017-03-12 | Discharge: 2017-03-12 | Disposition: A | Discharge status: Home / Self Care | Payer: BLUE CROSS/BLUE SHIELD | Source: Home / Self Care | Attending: Emergency Medicine | Admitting: Emergency Medicine

## 2017-03-12 ENCOUNTER — Encounter: Payer: Self-pay | Admitting: Emergency Medicine

## 2017-03-12 DIAGNOSIS — R600 Localized edema: Secondary | ICD-10-CM

## 2017-03-12 DIAGNOSIS — R1012 Left upper quadrant pain: Secondary | ICD-10-CM

## 2017-03-12 DIAGNOSIS — R1011 Right upper quadrant pain: Secondary | ICD-10-CM

## 2017-03-12 DIAGNOSIS — R102 Pelvic and perineal pain: Secondary | ICD-10-CM

## 2017-03-12 HISTORY — DX: Type 2 diabetes mellitus without complications: E11.9

## 2017-03-12 LAB — POCT CBC W AUTO DIFF (K'VILLE URGENT CARE)

## 2017-03-12 LAB — POCT URINALYSIS DIP (MANUAL ENTRY)
Bilirubin, UA: NEGATIVE
Blood, UA: NEGATIVE
Glucose, UA: 500 mg/dL — AB
Leukocytes, UA: NEGATIVE
Nitrite, UA: NEGATIVE
Protein Ur, POC: NEGATIVE mg/dL
Spec Grav, UA: 1.025 (ref 1.010–1.025)
Urobilinogen, UA: 0.2 E.U./dL
pH, UA: 5.5 (ref 5.0–8.0)

## 2017-03-12 LAB — POCT URINE PREGNANCY: Preg Test, Ur: NEGATIVE

## 2017-03-12 MED ORDER — FUROSEMIDE 20 MG PO TABS: 20.0000 mg | ORAL_TABLET | Freq: Two times a day (BID) | ORAL | 0 refills | Status: DC

## 2017-03-12 NOTE — ED Triage Notes (Signed)
Pt c/o swelling in her feet bilaterally x 3 mths. She also c/o LT side abd pain x today.

## 2017-03-12 NOTE — ED Provider Notes (Signed)
[facility]    CSN: [id number] Arrival date & time: 03/12/17  1745     History   Chief Complaint Chief Complaint  Patient presents with  . Foot Pain  . Abdominal Pain  She states her most important chief complaint is abdominal pain/fullness.  She has a second chief complaint of bilateral intermittent foot swelling and discomfort for 3 months.  HPI Stacey Delgado is a 31 y.o. female.   The history is provided by the patient.  Abdominal Pain  Pain location:  LUQ, RUQ and suprapubic Pain quality: cramping, dull, fullness and pressure   Pain radiates to:  Does not radiate Pain severity:  Mild Onset quality:  Unable to specify Duration:  1 day Timing:  Unable to specify Progression:  Unchanged Chronicity:  New Context: not awakening from sleep, not diet changes, not eating, not recent illness, not recent travel, not sick contacts, not suspicious food intake and not trauma   Relieved by:  Nothing Worsened by:  Palpation Ineffective treatments:  None tried Associated symptoms: fatigue   Associated symptoms: no anorexia, no chest pain, no chills, no constipation, no cough, no diarrhea, no dysuria, no fever, no hematemesis, no hematochezia, no hematuria, no melena, no nausea, no shortness of breath, no sore throat, no vaginal bleeding, no vaginal discharge and no vomiting   Risk factors: obesity   Risk factors: no alcohol abuse and no recent hospitalization    Her bilateral foot swelling and discomfort improved last night after she soaked both feet in Epsom salts.  Past Medical History:  Diagnosis Date  . Asthma    no inhaler  . BV (bacterial vaginosis)    2012  . Diabetes mellitus without complication (HCC)   . Endometrial mass 09/05/2011  . H/O chlamydia infection   . H/O menorrhagia   . Headache(784.0)    otc med prn  . Hirsutism   . Oligomenorrhea   . Trichomonas   . 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 Vitamin D deficiency      Patient Active Problem List   Diagnosis Date Noted  . 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 Increased BMI 03/04/2012    Past Surgical History:  Procedure Laterality Date  . EAR CYST EXCISION    . HYSTEROSCOPY W/D&C  09/13/2011   Procedure: DILATATION AND CURETTAGE (D&C) /HYSTEROSCOPY;  Surgeon: Purcell Nails, MD;  Location: [facility];  Service: Gynecology;  Laterality: N/A;    OB History    No data available       Home Medications    Prior to Admission medications   Medication Sig Start Date End Date Taking? Authorizing Provider  furosemide (LASIX) 20 MG tablet Take 1 tablet (20 mg total) by mouth 2 (two) times daily. IF NEEDED for swelling or edema 03/12/17   Lajean Manes, MD  Prenatal Vit-Fe Fumarate-FA (PRENATAL MULTIVITAMIN) TABS Take 1 tablet by mouth daily.    Historical Provider, MD    Family History Family History  Problem Relation Age of Onset  . Cancer Mother     Social History Social History  Substance Use Topics  . Smoking status: Former Smoker    Types: Cigarettes    Quit date: 04/09/2009  . Smokeless tobacco: Never Used  . Alcohol use No     Comment: 1-2- drinks per week     Allergies   Benadryl [diphenhydramine] and Cherry   Review of Systems Review of Systems  Constitutional: Positive for fatigue. Negative for chills and fever.  HENT: Negative for sore throat.   Respiratory: Negative for cough, chest tightness and shortness of breath.   Cardiovascular: Negative for chest pain and palpitations.  Gastrointestinal: Positive for abdominal pain. Negative for anorexia, constipation, diarrhea, hematemesis, hematochezia, melena, nausea and vomiting.  Genitourinary: Negative for dysuria, hematuria, vaginal bleeding and vaginal discharge.  Musculoskeletal:       Intermittent bilateral foot swelling and pain for 3 months. Denies calf pain. Recalls no injury  Skin: Negative for color change, rash and wound.  All other systems reviewed and are  negative.    Physical Exam Triage Vital Signs ED Triage Vitals  Enc Vitals Group     BP 03/12/17 1807 133/87     Pulse Rate 03/12/17 1807 87     Resp 03/12/17 1807 18     Temp 03/12/17 1807 98.3 F (36.8 C)     Temp Source 03/12/17 1807 Oral     SpO2 03/12/17 1807 98 %     Weight 03/12/17 1810 269 lb (122 kg)     Height 03/12/17 1810  (1.626 m)     Head Circumference --      Peak Flow --      Pain Score 03/12/17 1810 0     Pain Loc --      Pain Edu? --      Excl. in GC? --    No data found.   Updated Vital Signs BP 133/87 (BP Location: Left Arm)   Pulse 87   Temp 98.3 F (36.8 C) (Oral)   Resp 18   Ht  (1.626 m)   Wt 269 lb (122 kg)   LMP 01/13/2016 Comment: implannon removed 11/17'  SpO2 98%   BMI 46.17 kg/m    Physical Exam  Constitutional: She is oriented to person, place, and time. She appears well-developed and well-nourished. No distress.  Very obese female, cooperative, appears mildly uncomfortable but does not appear acutely ill. Nontoxic appearance. No acute distress.  HENT:  Head: Normocephalic and atraumatic.  Mouth/Throat: Oropharynx is clear and moist.  Neck: Normal range of motion.  Cardiovascular: Normal rate and regular rhythm.   Pulmonary/Chest: Effort normal.  Abdominal: Soft. Bowel sounds are normal. She exhibits distension (Mildly distended versus very obese abdomen). There is no rebound and no guarding. No hernia.  +Tenderness right upper quadrant, right mid abdomen, left upper quadrant, left mid abdomen, and suprapubic area. No masses guarding or rebound.  Musculoskeletal:  Trace bilateral pedal edema without any specific tender trigger point. Trace bilateral pretibial edema.  Homans sign negative bilaterally. No calf tenderness. No cords felt.  Neurological: She is alert and oriented to person, place, and time. No cranial nerve deficit.  Skin: Capillary refill takes less than 2 seconds. No rash noted. She is not diaphoretic.   Psychiatric: She has a normal mood and affect.     UC Treatments / Results  Labs (all labs ordered are listed, but only abnormal results are displayed) Labs Reviewed  POCT URINALYSIS DIP (MANUAL ENTRY) - Abnormal; Notable for the following:       Result Value   Clarity, UA cloudy (*)    Glucose, UA =500 (*)    Ketones, POC UA trace (5) (*)    All other components within normal limits  POCT CBC W AUTO DIFF ([facility])  POCT URINE PREGNANCY   Urine pregnancy test negative. CBC: WBC 11.4, normal differential    Hemoglobin 14.5   Platelets normal 212,000   EKG  EKG Interpretation None       Radiology No results found.  Procedures Procedures (including critical care time)  Medications Ordered in UC Medications - No data to display   Initial Impression / Assessment and Plan / UC Course  I have reviewed the triage vital signs and the nursing notes.  Pertinent labs & imaging results that were available during my care of the patient were reviewed by me and considered in my medical decision making (see chart for details).  Final Clinical Impressions(s) / UC Diagnoses  Clinically, no evidence of acute abdomen. No right lower quadrant tenderness.  Intermittent foot swelling for 3 months likely from venous insufficiency and dietary indiscretion of eating too much salty foods.  She admits that she's not following healthy diet and has not seen her doctor for follow-up of diabetes in some time.    Final diagnoses:  Right upper quadrant abdominal pain  Left upper quadrant pain  Suprapubic pain  Bilateral leg edema   Treatment options discussed, as well as risks, benefits, alternatives. Patient voiced understanding and agreement with the following plans: Trial of short-term low-dose furosemide to help with venous insufficiency and edema. Support socks/stockings. At time of discharge from urgent care, she states her abdominal pain is only minimal and she declined  any further workup or treatment for this at this time. (Work note given at her request)  New Prescriptions New Prescriptions   FUROSEMIDE (LASIX) 20 MG TABLET    Take 1 tablet (20 mg total) by mouth 2 (two) times daily. IF NEEDED for swelling or edema  I urged her to follow up within the next week with her PCP to reevaluate all the above and recheck diabetes. Also urged her to follow up with her gynecologist within the next week for pelvic exam and preventative GYN care.  Precautions discussed. Red flags discussed.-Emergency room if any red flag or any severe or worsening symptom. Questions invited and answered. Patient voiced understanding and agreement.    Lajean Manes, MD 03/14/17 [phone]

## 2017-03-15 ENCOUNTER — Telehealth: Payer: Self-pay | Admitting: Emergency Medicine

## 2017-03-15 NOTE — Telephone Encounter (Signed)
Left message inquiring about patient's status and encouraged her to call with questions/concerns. pk

## 2017-03-16 ENCOUNTER — Telehealth: Payer: Self-pay | Admitting: Emergency Medicine

## 2017-03-19 ENCOUNTER — Encounter: Payer: Self-pay | Admitting: *Deleted

## 2017-03-19 ENCOUNTER — Emergency Department
Admission: EM | Admit: 2017-03-19 | Discharge: 2017-03-19 | Disposition: A | Discharge status: Home / Self Care | Payer: BLUE CROSS/BLUE SHIELD | Source: Home / Self Care | Attending: Family Medicine | Admitting: Family Medicine

## 2017-03-19 DIAGNOSIS — J069 Acute upper respiratory infection, unspecified: Secondary | ICD-10-CM | POA: Diagnosis not present

## 2017-03-19 DIAGNOSIS — B9789 Other viral agents as the cause of diseases classified elsewhere: Secondary | ICD-10-CM

## 2017-03-19 DIAGNOSIS — J9801 Acute bronchospasm: Secondary | ICD-10-CM

## 2017-03-19 MED ORDER — ALBUTEROL SULFATE HFA 108 (90 BASE) MCG/ACT IN AERS: 2.0000 | INHALATION_SPRAY | RESPIRATORY_TRACT | 0 refills | Status: AC | PRN

## 2017-03-19 MED ORDER — AZITHROMYCIN 250 MG PO TABS: ORAL_TABLET | ORAL | 0 refills | Status: DC

## 2017-03-19 MED ORDER — BENZONATATE 200 MG PO CAPS: ORAL_CAPSULE | ORAL | 0 refills | Status: DC

## 2017-03-19 NOTE — ED Provider Notes (Signed)
[name]    CSN: [id number] Arrival date & time: 03/19/17  1705     History   Chief Complaint Chief Complaint  Patient presents with  . Cough  . Shortness of Breath    HPI Stacey Delgado is a 31 y.o. female.   Patient complains of four day history of typical cold-like symptoms developing over several days, including mild sore throat, sinus congestion, fatigue, and cough.  She has a history of asthma, and began wheezing today.  Her cough is productive and worse at night.  No pleuritic pain.  She has poorly controlled diabetes.   The history is provided by the patient.    Past Medical History:  Diagnosis Date  . Asthma    no inhaler  . BV (bacterial vaginosis)    2012  . Diabetes mellitus without complication (HCC)   . Endometrial mass 09/05/2011  . H/O chlamydia infection   . H/O menorrhagia   . Headache(784.0)    otc med prn  . Hirsutism   . Oligomenorrhea   . Trichomonas   . 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 Vitamin D deficiency     Patient Active Problem List   Diagnosis Date Noted  . 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 Increased BMI 03/04/2012    Past Surgical History:  Procedure Laterality Date  . EAR CYST EXCISION    . HYSTEROSCOPY W/D&C  09/13/2011   Procedure: DILATATION AND CURETTAGE (D&C) /HYSTEROSCOPY;  Surgeon: Purcell Nails, MD;  Location: [facility];  Service: Gynecology;  Laterality: N/A;    OB History    No data available       Home Medications    Prior to Admission medications   Medication Sig Start Date End Date Taking? Authorizing Provider  albuterol (PROVENTIL HFA;VENTOLIN HFA) 108 (90 Base) MCG/ACT inhaler Inhale 2 puffs into the lungs every 4 (four) hours as needed for wheezing or shortness of breath. 03/19/17   Lattie Haw, MD  azithromycin (ZITHROMAX Z-PAK) 250 MG tablet Take 2 tabs today; then begin one tab once daily for 4 more days. (Rx void after 03/27/17) 03/19/17   Lattie Haw, MD    benzonatate (TESSALON) 200 MG capsule Take one cap by mouth at bedtime as needed for cough.  May repeat in 4 to 6 hours 03/19/17   Lattie Haw, MD  furosemide (LASIX) 20 MG tablet Take 1 tablet (20 mg total) by mouth 2 (two) times daily. IF NEEDED for swelling or edema 03/12/17   Lajean Manes, MD  Prenatal Vit-Fe Fumarate-FA (PRENATAL MULTIVITAMIN) TABS Take 1 tablet by mouth daily.    Historical Provider, MD    Family History Family History  Problem Relation Age of Onset  . Cancer Mother     Social History Social History  Substance Use Topics  . Smoking status: Former Smoker    Types: Cigarettes    Quit date: 04/09/2009  . Smokeless tobacco: Never Used  . Alcohol use No     Comment: 1-2- drinks per week     Allergies   Benadryl [diphenhydramine] and Cherry   Review of Systems Review of Systems + sore throat + cough No pleuritic pain + wheezing + nasal congestion + post-nasal drainage No sinus pain/pressure No itchy/red eyes No earache No hemoptysis + SOB No fever/chills No nausea No vomiting No abdominal pain No diarrhea No urinary symptoms No skin rash + fatigue No myalgias No headache Used OTC meds without relief   Physical  Exam Triage Vital Signs ED Triage Vitals  Enc Vitals Group     BP 03/19/17 1724 111/78     Pulse Rate 03/19/17 1724 81     Resp 03/19/17 1724 16     Temp 03/19/17 1724 98.1 F (36.7 C)     Temp Source 03/19/17 1724 Oral     SpO2 03/19/17 1724 100 %     Weight 03/19/17 1725 267 lb (121.1 kg)     Height 03/19/17 1725  (1.626 m)     Head Circumference --      Peak Flow --      Pain Score 03/19/17 1725 0     Pain Loc --      Pain Edu? --      Excl. in GC? --    No data found.   Updated Vital Signs BP 111/78 (BP Location: Left Arm)   Pulse 81   Temp 98.1 F (36.7 C) (Oral)   Resp 16   Ht  (1.626 m)   Wt 267 lb (121.1 kg)   SpO2 100%   BMI 45.83 kg/m   Visual Acuity Right Eye Distance:   Left Eye  Distance:   Bilateral Distance:    Right Eye Near:   Left Eye Near:    Bilateral Near:     Physical Exam Nursing notes and Vital Signs reviewed. Appearance:  Patient appears stated age, and in no acute distress Eyes:  Pupils are equal, round, and reactive to light and accomodation.  Extraocular movement is intact.  Conjunctivae are not inflamed  Ears:  Canals normal.  Tympanic membranes normal.  Nose:  Mildly congested turbinates.  No sinus tenderness.    Pharynx:  Normal Neck:  Supple.  Tender enlarged posterior/lateral nodes are palpated bilaterally  Lungs:  Clear to auscultation.  Breath sounds are equal.  Moving air well. Heart:  Regular rate and rhythm without murmurs, rubs, or gallops.  Abdomen:  Nontender without masses or hepatosplenomegaly.  Bowel sounds are present.  No CVA or flank tenderness.  Extremities:  No edema.  Skin:  No rash present.    UC Treatments / Results  Labs (all labs ordered are listed, but only abnormal results are displayed) Labs Reviewed - No data to display  EKG  EKG Interpretation None       Radiology No results found.  Procedures Procedures (including critical care time)  Medications Ordered in UC Medications - No data to display   Initial Impression / Assessment and Plan / UC Course  I have reviewed the triage vital signs and the nursing notes.  Pertinent labs & imaging results that were available during my care of the patient were reviewed by me and considered in my medical decision making (see chart for details).    Patient would benefit from oral steroid; however, will not prescribe because of poorly controlled diabetes. Prescription written for Benzonatate [facility]) to take at bedtime for night-time cough.  Rx for albuterol inhaler. Take plain guaifenesin (  extended release tabs such as Mucinex) twice daily, with plenty of water, for cough and congestion.  May add Pseudoephedrine ( , one or two every 4 to 6 hours)  for sinus congestion.  Get adequate rest.   May use Afrin nasal spray (or generic oxymetazoline) each morning for about 5 days and then discontinue.  Also recommend using saline nasal spray several times daily and saline nasal irrigation (AYR is a common brand).    Try warm salt water gargles for sore  throat.  Stop all antihistamines for now, and other non-prescription cough/cold preparations. May take Ibuprofen , 4 tabs every 8 hours with food for body aches, headache, etc. Begin Azithromycin if not improving about one week or if persistent fever develops (Given a prescription to hold, with an expiration date)  Follow-up with family doctor if not improving about10 days.     Final Clinical Impressions(s) / UC Diagnoses   Final diagnoses:  Viral URI with cough  Bronchospasm    New Prescriptions New Prescriptions   ALBUTEROL (PROVENTIL HFA;VENTOLIN HFA) 108 (90 BASE) MCG/ACT INHALER    Inhale 2 puffs into the lungs every 4 (four) hours as needed for wheezing or shortness of breath.   AZITHROMYCIN (ZITHROMAX Z-PAK) 250 MG TABLET    Take 2 tabs today; then begin one tab once daily for 4 more days. (Rx void after 03/27/17)   BENZONATATE (TESSALON) 200 MG CAPSULE    Take one cap by mouth at bedtime as needed for cough.  May repeat in 4 to 6 hours     Lattie Haw, MD 03/30/17 [phone]

## 2017-03-19 NOTE — ED Triage Notes (Signed)
Pt c/o productive cough and nasal congestion x 4 days. Denies fever. Taking Sudafed. She c/o SOB x last night, she took Robtussin that had cherry flavoring which she is allergic to. She believes she was able to vomit it back up.

## 2017-03-19 NOTE — Discharge Instructions (Signed)
Take plain guaifenesin (  extended release tabs such as Mucinex) twice daily, with plenty of water, for cough and congestion.  May add Pseudoephedrine ( , one or two every 4 to 6 hours) for sinus congestion.  Get adequate rest.   May use Afrin nasal spray (or generic oxymetazoline) each morning for about 5 days and then discontinue.  Also recommend using saline nasal spray several times daily and saline nasal irrigation (AYR is a common brand).    Try warm salt water gargles for sore throat.  Stop all antihistamines for now, and other non-prescription cough/cold preparations. May take Ibuprofen , 4 tabs every 8 hours with food for body aches, headache, etc. Begin Azithromycin if not improving about one week or if persistent fever develops   Follow-up with family doctor if not improving about10 days.

## 2018-03-02 IMAGING — DX DG CHEST 2V
2 series · 2 of 2 positions shown · non-contrast
Comparison: None.

CLINICAL DATA: Cough, wheezing and chest tightness for 3 days.

EXAM:
CHEST  2 VIEW

[chest pa]
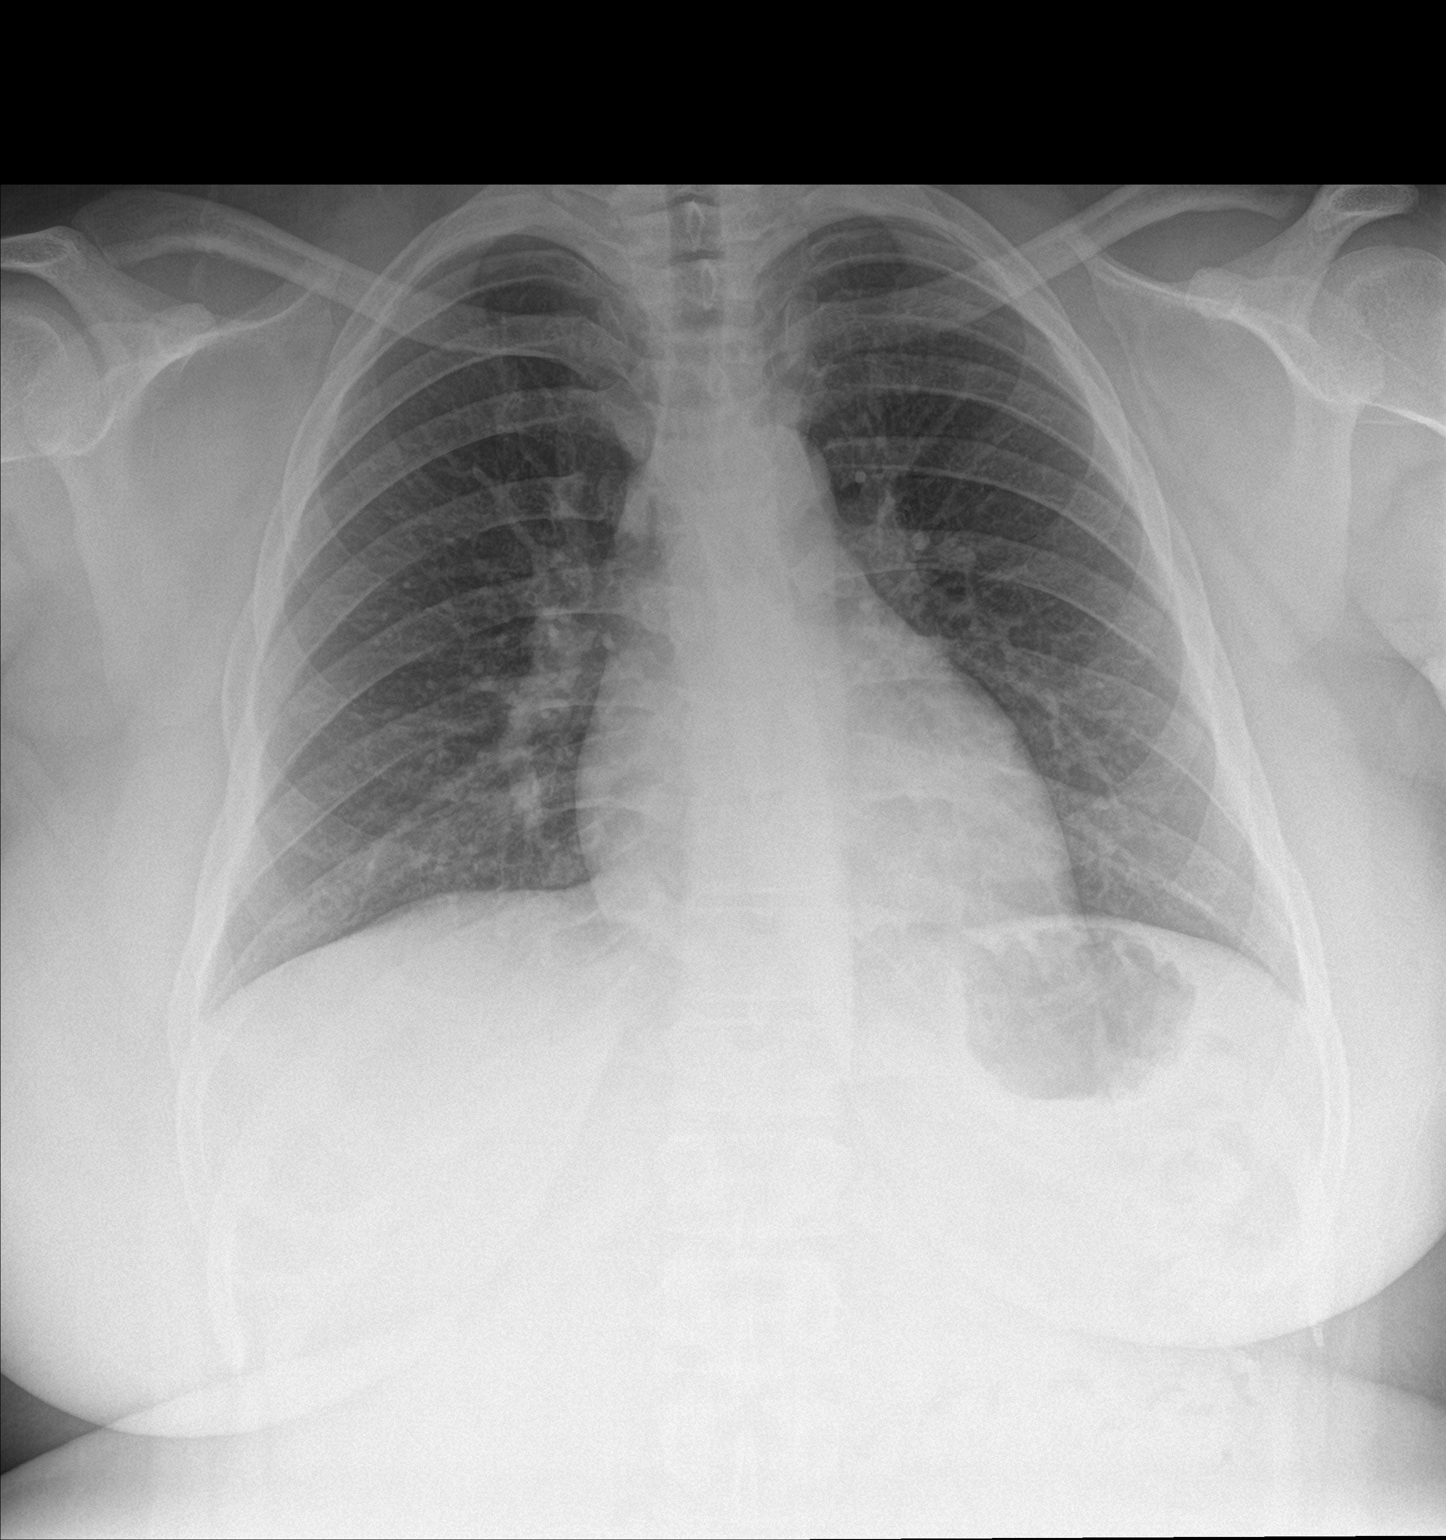

[chest lat]
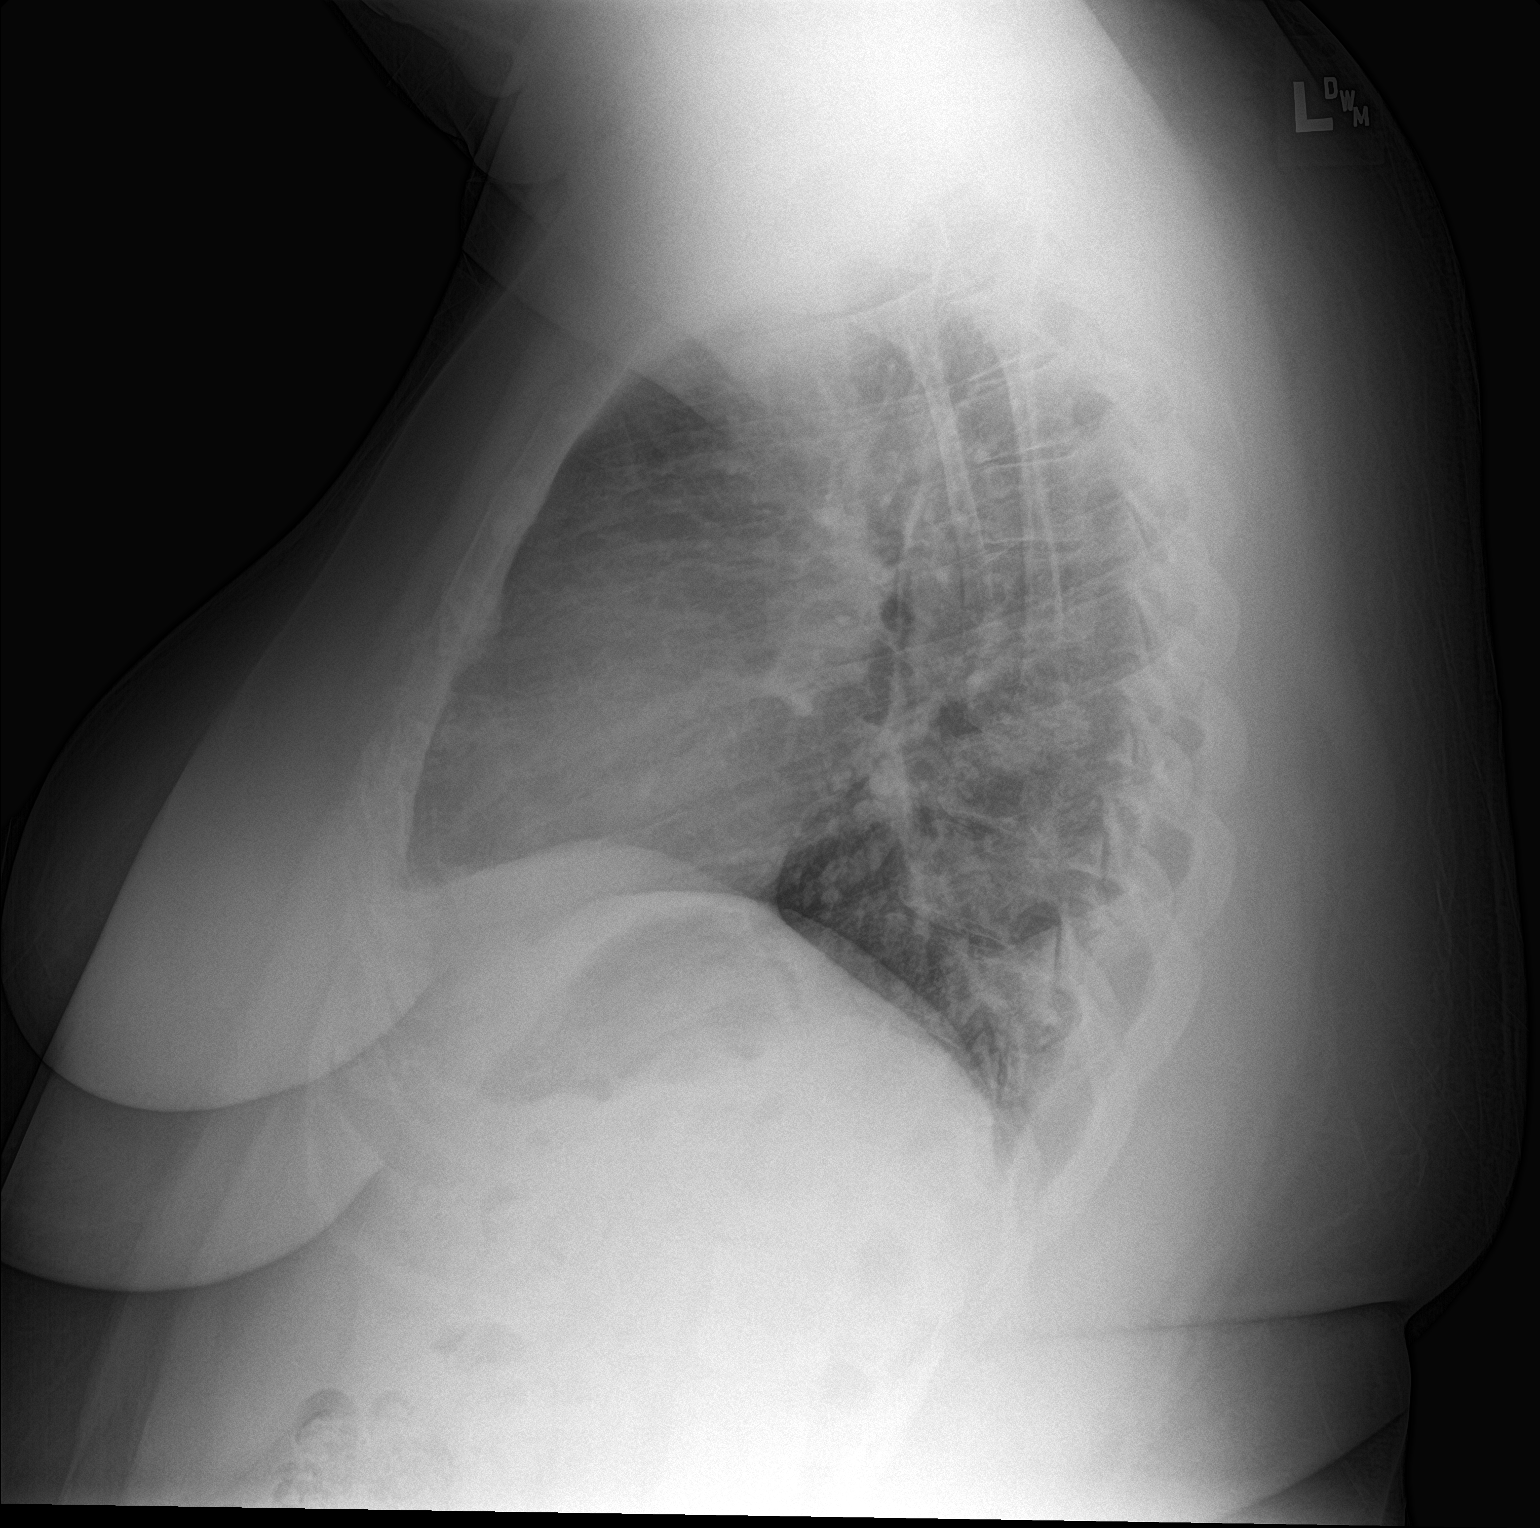

[2 of 2 positions shown; findings below may reference images not displayed]

FINDINGS: The cardiomediastinal silhouette is unremarkable.

Mild peribronchial thickening noted.

There is no evidence of focal airspace disease, pulmonary edema,
suspicious pulmonary nodule/mass, pleural effusion, or pneumothorax.
No acute bony abnormalities are identified.
IMPRESSION: Mild peribronchial thickening without focal pneumonia, of uncertain
chronicity

## 2022-01-08 ENCOUNTER — Emergency Department
Admission: EM | Admit: 2022-01-08 | Discharge: 2022-01-08 | Disposition: A | Discharge status: Home / Self Care | Payer: BC Managed Care – PPO | Source: Home / Self Care

## 2022-01-08 ENCOUNTER — Other Ambulatory Visit: Payer: Self-pay

## 2022-01-08 DIAGNOSIS — L732 Hidradenitis suppurativa: Secondary | ICD-10-CM | POA: Diagnosis not present

## 2022-01-08 MED ORDER — DOXYCYCLINE HYCLATE 100 MG PO CAPS: 100.0000 mg | ORAL_CAPSULE | Freq: Two times a day (BID) | ORAL | 0 refills | Status: AC

## 2022-01-08 MED ORDER — MUPIROCIN 2 % EX OINT: 1.0000 "application " | TOPICAL_OINTMENT | Freq: Two times a day (BID) | CUTANEOUS | 0 refills | Status: AC

## 2022-01-08 NOTE — ED Triage Notes (Signed)
Pt presents with rt breast swelling and "bumps" that began thursday

## 2022-01-08 NOTE — Discharge Instructions (Signed)
Take the doxycycline antibiotic 2 times a day for a week.  Make sure to take with food Wash area 2 times a day and apply small amount of mupirocin ointment. Keep mupirocin ointment for future use, it will help to treat small infections in the skin when applied

## 2022-01-08 NOTE — ED Provider Notes (Signed)
[name]    CSN: [id number] Arrival date & time: 01/08/22  1321      History   Chief Complaint No chief complaint on file.   HPI Stacey Delgado is a 36 y.o. female.   HPI  History of hidradenitis supperitiva.  She has several small abscesses around her breasts.  She is here for evaluation.  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      Home Medications    Prior to Admission medications   Medication Sig Start Date End Date Taking? Authorizing Provider  doxycycline (VIBRAMYCIN) 100 MG capsule Take 1 capsule (100 mg total) by mouth 2 (two) times daily. 01/08/22  Yes Eustace Moore, MD  glipiZIDE (GLUCOTROL XL) 10 MG 24 hr tablet Take 10 mg by mouth daily with breakfast.   Yes [provider]  mupirocin ointment (BACTROBAN) 2 % Apply 1 application topically 2 (two) times daily. 01/08/22  Yes Eustace Moore, MD  albuterol (PROVENTIL HFA;VENTOLIN HFA) 108 (90 Base) MCG/ACT inhaler Inhale 2 puffs into the lungs every 4 (four) hours as needed for wheezing or shortness of breath. 03/19/17   Lattie Haw, MD  Prenatal Vit-Fe Fumarate-FA (PRENATAL MULTIVITAMIN) TABS Take 1 tablet by mouth  daily.    [provider]    Family History Family History  Problem Relation Age of Onset   Cancer Mother     Social History Social History   Tobacco Use   Smoking status: Former    Types: Cigarettes    Quit date: 04/09/2009    Years since quitting: 12.7   Smokeless tobacco: Never  Substance Use Topics   Alcohol use: No    Alcohol/week: 1.0 standard drink    Types: 1 Standard drinks or equivalent per week    Comment: 1-2- drinks per week   Drug use: No     Allergies   Benadryl [diphenhydramine] and Cherry   Review of Systems Review of Systems See HPI  Physical Exam Triage Vital Signs ED Triage Vitals  Enc Vitals Group     BP 01/08/22 1403 111/76     Pulse Rate 01/08/22 1403 82     Resp 01/08/22 1403 16     Temp 01/08/22 1403 98.8 F (37.1 C)     Temp Source 01/08/22 1403 Oral     SpO2 01/08/22 1403 96 %     Weight 01/08/22 1406 272 lb (123.4 kg)     Height --      Head Circumference --      Peak Flow --  Pain Score 01/08/22 1405 5     Pain Loc --      Pain Edu? --      Excl. in GC? --    No data found.  Updated Vital Signs BP 111/76 (BP Location: Left Arm)    Pulse 82    Temp 98.8 F (37.1 C) (Oral)    Resp 16    Wt 123.4 kg    SpO2 96%    BMI 46.69 kg/m     Physical Exam Constitutional:      General: She is not in acute distress.    Appearance: She is well-developed.  HENT:     Head: Normocephalic and atraumatic.  Eyes:     Conjunctiva/sclera: Conjunctivae normal.     Pupils: Pupils are equal, round, and reactive to light.  Cardiovascular:     Rate and Rhythm: Normal rate.  Pulmonary:     Effort: Pulmonary effort is normal. No respiratory distress.  Chest:       Comments: Multiple small abscesses, the lower 1 on the right does measure 2 cm across and the other 2 are smaller.  There is faint erythema of the skin surrounding the lower infection consistent with cellulitis. Abdominal:     General: There is no distension.      Palpations: Abdomen is soft.  Musculoskeletal:        General: Normal range of motion.     Cervical back: Normal range of motion.  Skin:    General: Skin is warm and dry.  Neurological:     Mental Status: She is alert.     UC Treatments / Results  Labs (all labs ordered are listed, but only abnormal results are displayed) Labs Reviewed - No data to display  EKG   Radiology No results found.  Procedures Procedures (including critical care time)  Medications Ordered in UC Medications - No data to display  Initial Impression / Assessment and Plan / UC Course  I have reviewed the triage vital signs and the nursing notes.  Pertinent labs & imaging results that were available during my care of the patient were reviewed by me and considered in my medical decision making (see chart for details).     Reviewed the importance of primary care and follow-up if this fails to resolve completely in 1 week Final Clinical Impressions(s) / UC Diagnoses   Final diagnoses:  Hidradenitis suppurativa     Discharge Instructions      Take the doxycycline antibiotic 2 times a day for a week.  Make sure to take with food Wash area 2 times a day and apply small amount of mupirocin ointment. Keep mupirocin ointment for future use, it will help to treat small infections in the skin when applied     ED Prescriptions     Medication Sig Dispense Auth. Provider   doxycycline (VIBRAMYCIN) 100 MG capsule Take 1 capsule (100 mg total) by mouth 2 (two) times daily. 14 capsule Eustace Moore, MD   mupirocin ointment (BACTROBAN) 2 % Apply 1 application topically 2 (two) times daily. 22 g Eustace Moore, MD      PDMP not reviewed this encounter.   Eustace Moore, MD 01/08/22 [phone]
